# Patient Record
Sex: Female | Born: 1950 | Race: White | Hispanic: Yes | Marital: Married
Health system: Southern US, Community
[De-identification: ages and names within clinical notes are randomized; demographics above are authoritative.]

---

## 1999-08-03 ENCOUNTER — Other Ambulatory Visit: Admission: RE | Admit: 1999-08-03 | Discharge: 1999-08-03 | Payer: Self-pay | Admitting: Family Medicine

## 1999-08-20 ENCOUNTER — Encounter: Admission: RE | Admit: 1999-08-20 | Discharge: 1999-08-20 | Payer: Self-pay | Admitting: Family Medicine

## 1999-08-20 ENCOUNTER — Encounter: Payer: Self-pay | Admitting: Family Medicine

## 2001-05-22 ENCOUNTER — Other Ambulatory Visit: Admission: RE | Admit: 2001-05-22 | Discharge: 2001-05-22 | Payer: Self-pay | Admitting: Family Medicine

## 2004-04-06 ENCOUNTER — Other Ambulatory Visit: Admission: RE | Admit: 2004-04-06 | Discharge: 2004-04-06 | Payer: Self-pay | Admitting: Physician Assistant

## 2004-04-25 ENCOUNTER — Encounter: Admission: RE | Admit: 2004-04-25 | Discharge: 2004-04-25 | Payer: Self-pay | Admitting: Family Medicine

## 2004-05-29 ENCOUNTER — Ambulatory Visit (HOSPITAL_COMMUNITY): Admission: RE | Admit: 2004-05-29 | Discharge: 2004-05-29 | Payer: Self-pay | Admitting: Gastroenterology

## 2008-04-27 ENCOUNTER — Other Ambulatory Visit: Admission: RE | Admit: 2008-04-27 | Discharge: 2008-04-27 | Payer: Self-pay | Admitting: Family Medicine

## 2008-05-24 ENCOUNTER — Encounter: Admission: RE | Admit: 2008-05-24 | Discharge: 2008-05-24 | Payer: Self-pay | Admitting: Family Medicine

## 2008-05-30 ENCOUNTER — Encounter: Admission: RE | Admit: 2008-05-30 | Discharge: 2008-05-30 | Payer: Self-pay | Admitting: Family Medicine

## 2011-01-18 NOTE — Op Note (Signed)
NAME:  April Reyes, April Reyes NO.:  [id number]   MEDICAL RECORD NO.:  [id number]          PATIENT TYPE:  AMB   LOCATION:  ENDO                         FACILITY:  [facility]   PHYSICIAN:  Graylin Shiver, M.D.   DATE OF BIRTH:  [birth year]   DATE OF PROCEDURE:  05/29/2004  DATE OF DISCHARGE:                                 OPERATIVE REPORT   PROCEDURE:  Colonoscopy.   SURGEON:   INDICATIONS FOR PROCEDURE:  Screening.   Informed consent was obtained after explanation of the risks of bleeding,  infection and perforation.   PREMEDICATION:  Fentanyl 60 mcg IV and Versed 6 mg IV.   DESCRIPTION OF PROCEDURE:  With the patient in the left lateral decubitus  position, a rectal examination was performed.  No masses were felt.  The  Olympus colonoscope was inserted into the rectum and advanced around to the  colon to the cecum.  The cecal landmarks were identified.  The cecum and  ascending colon were normal.  The transverse colon was normal.  The  descending colon, sigmoid and rectum were normal.  The patient tolerated the  procedure well without complications.   IMPRESSION:  Normal colonoscopy to the cecum.   RECOMMENDATIONS:  I would recommend a follow-up colonoscopy again in 10  years.       SFG/MEDQ  D:  05/29/2004  T:  05/29/2004  Job:  [id number]   cc:   Duncan Dull, M.D.  [address]  [place]  [place] [address]  Fax: [phone]

## 2012-06-24 ENCOUNTER — Other Ambulatory Visit: Payer: Self-pay | Admitting: Family Medicine

## 2012-06-24 ENCOUNTER — Other Ambulatory Visit (HOSPITAL_COMMUNITY)
Admission: RE | Admit: 2012-06-24 | Discharge: 2012-06-24 | Disposition: A | Discharge status: Home / Self Care | Payer: BC Managed Care – PPO | Source: Ambulatory Visit | Attending: Family Medicine | Admitting: Family Medicine

## 2012-06-24 DIAGNOSIS — Z124 Encounter for screening for malignant neoplasm of cervix: Secondary | ICD-10-CM | POA: Insufficient documentation

## 2012-06-25 ENCOUNTER — Other Ambulatory Visit: Payer: Self-pay | Admitting: Family Medicine

## 2012-06-25 DIAGNOSIS — Z1231 Encounter for screening mammogram for malignant neoplasm of breast: Secondary | ICD-10-CM

## 2012-07-02 ENCOUNTER — Ambulatory Visit
Admission: RE | Admit: 2012-07-02 | Discharge: 2012-07-02 | Disposition: A | Discharge status: Home / Self Care | Payer: BC Managed Care – PPO | Source: Ambulatory Visit | Attending: Family Medicine | Admitting: Family Medicine

## 2012-07-02 DIAGNOSIS — Z1231 Encounter for screening mammogram for malignant neoplasm of breast: Secondary | ICD-10-CM

## 2012-07-03 ENCOUNTER — Other Ambulatory Visit: Payer: Self-pay | Admitting: Family Medicine

## 2012-07-03 DIAGNOSIS — R928 Other abnormal and inconclusive findings on diagnostic imaging of breast: Secondary | ICD-10-CM

## 2012-07-14 ENCOUNTER — Ambulatory Visit
Admission: RE | Admit: 2012-07-14 | Discharge: 2012-07-14 | Disposition: A | Discharge status: Home / Self Care | Payer: BC Managed Care – PPO | Source: Ambulatory Visit | Attending: Family Medicine | Admitting: Family Medicine

## 2012-07-14 DIAGNOSIS — R928 Other abnormal and inconclusive findings on diagnostic imaging of breast: Secondary | ICD-10-CM

## 2013-09-23 ENCOUNTER — Other Ambulatory Visit: Payer: Self-pay | Admitting: Family Medicine

## 2013-09-23 DIAGNOSIS — M8580 Other specified disorders of bone density and structure, unspecified site: Secondary | ICD-10-CM

## 2013-11-23 ENCOUNTER — Other Ambulatory Visit: Payer: Self-pay

## 2013-11-23 DIAGNOSIS — Z1231 Encounter for screening mammogram for malignant neoplasm of breast: Secondary | ICD-10-CM

## 2013-12-15 ENCOUNTER — Ambulatory Visit
Admission: RE | Admit: 2013-12-15 | Discharge: 2013-12-15 | Disposition: A | Discharge status: Home / Self Care | Payer: BC Managed Care – PPO | Source: Ambulatory Visit | Attending: Family Medicine | Admitting: Family Medicine

## 2013-12-15 ENCOUNTER — Other Ambulatory Visit: Payer: Self-pay

## 2013-12-15 ENCOUNTER — Ambulatory Visit
Admission: RE | Admit: 2013-12-15 | Discharge: 2013-12-15 | Disposition: A | Discharge status: Home / Self Care | Payer: BC Managed Care – PPO | Source: Ambulatory Visit

## 2013-12-15 ENCOUNTER — Other Ambulatory Visit: Payer: Self-pay | Admitting: Family Medicine

## 2013-12-15 DIAGNOSIS — Z1231 Encounter for screening mammogram for malignant neoplasm of breast: Secondary | ICD-10-CM

## 2013-12-15 DIAGNOSIS — M8580 Other specified disorders of bone density and structure, unspecified site: Secondary | ICD-10-CM

## 2013-12-15 DIAGNOSIS — N6459 Other signs and symptoms in breast: Secondary | ICD-10-CM

## 2013-12-27 ENCOUNTER — Ambulatory Visit
Admission: RE | Admit: 2013-12-27 | Discharge: 2013-12-27 | Disposition: A | Discharge status: Home / Self Care | Payer: BC Managed Care – PPO | Source: Ambulatory Visit | Attending: Family Medicine | Admitting: Family Medicine

## 2013-12-27 DIAGNOSIS — N6459 Other signs and symptoms in breast: Secondary | ICD-10-CM

## 2014-09-28 ENCOUNTER — Other Ambulatory Visit (HOSPITAL_COMMUNITY)
Admission: RE | Admit: 2014-09-28 | Discharge: 2014-09-28 | Disposition: A | Discharge status: Home / Self Care | Payer: No Typology Code available for payment source | Source: Ambulatory Visit | Attending: Family Medicine | Admitting: Family Medicine

## 2014-09-28 ENCOUNTER — Other Ambulatory Visit: Payer: Self-pay | Admitting: Family Medicine

## 2014-09-28 DIAGNOSIS — Z1151 Encounter for screening for human papillomavirus (HPV): Secondary | ICD-10-CM | POA: Diagnosis present

## 2014-09-28 DIAGNOSIS — Z124 Encounter for screening for malignant neoplasm of cervix: Secondary | ICD-10-CM | POA: Insufficient documentation

## 2014-10-04 LAB — CYTOLOGY - PAP

## 2015-08-29 ENCOUNTER — Other Ambulatory Visit: Payer: Self-pay | Admitting: Gastroenterology

## 2017-11-06 ENCOUNTER — Other Ambulatory Visit: Payer: Self-pay | Admitting: Family Medicine

## 2017-11-06 ENCOUNTER — Other Ambulatory Visit (HOSPITAL_COMMUNITY)
Admission: RE | Admit: 2017-11-06 | Discharge: 2017-11-06 | Disposition: A | Discharge status: Home / Self Care | Payer: 59 | Source: Ambulatory Visit | Attending: Family Medicine | Admitting: Family Medicine

## 2017-11-06 DIAGNOSIS — Z01411 Encounter for gynecological examination (general) (routine) with abnormal findings: Secondary | ICD-10-CM | POA: Insufficient documentation

## 2017-11-07 LAB — CYTOLOGY - PAP: DIAGNOSIS: NEGATIVE

## 2017-11-10 ENCOUNTER — Other Ambulatory Visit: Payer: Self-pay | Admitting: Family Medicine

## 2017-11-10 DIAGNOSIS — Z139 Encounter for screening, unspecified: Secondary | ICD-10-CM

## 2017-11-10 DIAGNOSIS — M8580 Other specified disorders of bone density and structure, unspecified site: Secondary | ICD-10-CM

## 2017-12-03 ENCOUNTER — Ambulatory Visit
Admission: RE | Admit: 2017-12-03 | Discharge: 2017-12-03 | Disposition: A | Discharge status: Home / Self Care | Payer: Medicare Other | Source: Ambulatory Visit | Attending: Family Medicine | Admitting: Family Medicine

## 2017-12-03 DIAGNOSIS — H04129 Dry eye syndrome of unspecified lacrimal gland: Secondary | ICD-10-CM | POA: Diagnosis not present

## 2017-12-03 DIAGNOSIS — M8580 Other specified disorders of bone density and structure, unspecified site: Secondary | ICD-10-CM

## 2017-12-03 DIAGNOSIS — Z780 Asymptomatic menopausal state: Secondary | ICD-10-CM | POA: Diagnosis not present

## 2017-12-03 DIAGNOSIS — M85851 Other specified disorders of bone density and structure, right thigh: Secondary | ICD-10-CM | POA: Diagnosis not present

## 2017-12-03 DIAGNOSIS — Z1231 Encounter for screening mammogram for malignant neoplasm of breast: Secondary | ICD-10-CM | POA: Diagnosis not present

## 2017-12-03 DIAGNOSIS — Z139 Encounter for screening, unspecified: Secondary | ICD-10-CM

## 2017-12-12 DIAGNOSIS — J329 Chronic sinusitis, unspecified: Secondary | ICD-10-CM | POA: Diagnosis not present

## 2017-12-16 DIAGNOSIS — T360X5A Adverse effect of penicillins, initial encounter: Secondary | ICD-10-CM | POA: Diagnosis not present

## 2017-12-16 DIAGNOSIS — S93401A Sprain of unspecified ligament of right ankle, initial encounter: Secondary | ICD-10-CM | POA: Diagnosis not present

## 2017-12-16 DIAGNOSIS — M25571 Pain in right ankle and joints of right foot: Secondary | ICD-10-CM | POA: Diagnosis not present

## 2017-12-30 DIAGNOSIS — M25571 Pain in right ankle and joints of right foot: Secondary | ICD-10-CM | POA: Diagnosis not present

## 2018-09-30 DIAGNOSIS — H2513 Age-related nuclear cataract, bilateral: Secondary | ICD-10-CM | POA: Diagnosis not present

## 2018-09-30 DIAGNOSIS — H04123 Dry eye syndrome of bilateral lacrimal glands: Secondary | ICD-10-CM | POA: Diagnosis not present

## 2018-09-30 DIAGNOSIS — H52203 Unspecified astigmatism, bilateral: Secondary | ICD-10-CM | POA: Diagnosis not present

## 2018-11-13 DIAGNOSIS — Z23 Encounter for immunization: Secondary | ICD-10-CM | POA: Diagnosis not present

## 2018-11-13 DIAGNOSIS — N952 Postmenopausal atrophic vaginitis: Secondary | ICD-10-CM | POA: Diagnosis not present

## 2018-11-13 DIAGNOSIS — J309 Allergic rhinitis, unspecified: Secondary | ICD-10-CM | POA: Diagnosis not present

## 2018-11-13 DIAGNOSIS — Z131 Encounter for screening for diabetes mellitus: Secondary | ICD-10-CM | POA: Diagnosis not present

## 2018-11-13 DIAGNOSIS — Z136 Encounter for screening for cardiovascular disorders: Secondary | ICD-10-CM | POA: Diagnosis not present

## 2018-11-13 DIAGNOSIS — Z0000 Encounter for general adult medical examination without abnormal findings: Secondary | ICD-10-CM | POA: Diagnosis not present

## 2020-03-08 DIAGNOSIS — E559 Vitamin D deficiency, unspecified: Secondary | ICD-10-CM | POA: Diagnosis not present

## 2020-03-08 DIAGNOSIS — Z13220 Encounter for screening for lipoid disorders: Secondary | ICD-10-CM | POA: Diagnosis not present

## 2020-03-08 DIAGNOSIS — Z23 Encounter for immunization: Secondary | ICD-10-CM | POA: Diagnosis not present

## 2020-03-08 DIAGNOSIS — Z136 Encounter for screening for cardiovascular disorders: Secondary | ICD-10-CM | POA: Diagnosis not present

## 2020-05-11 DIAGNOSIS — Z23 Encounter for immunization: Secondary | ICD-10-CM | POA: Diagnosis not present

## 2020-06-05 DIAGNOSIS — L821 Other seborrheic keratosis: Secondary | ICD-10-CM | POA: Diagnosis not present

## 2020-06-05 DIAGNOSIS — D2239 Melanocytic nevi of other parts of face: Secondary | ICD-10-CM | POA: Diagnosis not present

## 2020-06-05 DIAGNOSIS — L918 Other hypertrophic disorders of the skin: Secondary | ICD-10-CM | POA: Diagnosis not present

## 2020-06-05 DIAGNOSIS — L814 Other melanin hyperpigmentation: Secondary | ICD-10-CM | POA: Diagnosis not present

## 2020-06-05 DIAGNOSIS — L811 Chloasma: Secondary | ICD-10-CM | POA: Diagnosis not present

## 2020-06-05 DIAGNOSIS — L819 Disorder of pigmentation, unspecified: Secondary | ICD-10-CM | POA: Diagnosis not present

## 2020-06-09 DIAGNOSIS — H04123 Dry eye syndrome of bilateral lacrimal glands: Secondary | ICD-10-CM | POA: Diagnosis not present

## 2020-06-09 DIAGNOSIS — H524 Presbyopia: Secondary | ICD-10-CM | POA: Diagnosis not present

## 2020-06-09 DIAGNOSIS — D3131 Benign neoplasm of right choroid: Secondary | ICD-10-CM | POA: Diagnosis not present

## 2020-06-09 DIAGNOSIS — H2513 Age-related nuclear cataract, bilateral: Secondary | ICD-10-CM | POA: Diagnosis not present

## 2020-06-14 DIAGNOSIS — Z23 Encounter for immunization: Secondary | ICD-10-CM | POA: Diagnosis not present

## 2020-11-24 ENCOUNTER — Other Ambulatory Visit: Payer: Self-pay | Admitting: Family Medicine

## 2020-11-24 DIAGNOSIS — E559 Vitamin D deficiency, unspecified: Secondary | ICD-10-CM | POA: Diagnosis not present

## 2020-11-24 DIAGNOSIS — E7800 Pure hypercholesterolemia, unspecified: Secondary | ICD-10-CM | POA: Diagnosis not present

## 2020-11-24 DIAGNOSIS — M8580 Other specified disorders of bone density and structure, unspecified site: Secondary | ICD-10-CM

## 2020-11-24 DIAGNOSIS — Z8249 Family history of ischemic heart disease and other diseases of the circulatory system: Secondary | ICD-10-CM | POA: Diagnosis not present

## 2020-11-24 DIAGNOSIS — Z0000 Encounter for general adult medical examination without abnormal findings: Secondary | ICD-10-CM | POA: Diagnosis not present

## 2020-11-24 DIAGNOSIS — M8589 Other specified disorders of bone density and structure, multiple sites: Secondary | ICD-10-CM | POA: Diagnosis not present

## 2020-11-27 DIAGNOSIS — Z111 Encounter for screening for respiratory tuberculosis: Secondary | ICD-10-CM | POA: Diagnosis not present

## 2020-11-29 ENCOUNTER — Other Ambulatory Visit (HOSPITAL_COMMUNITY): Payer: Self-pay | Admitting: Family Medicine

## 2020-12-13 ENCOUNTER — Ambulatory Visit (HOSPITAL_COMMUNITY)
Admission: RE | Admit: 2020-12-13 | Discharge: 2020-12-13 | Disposition: A | Discharge status: Home / Self Care | Payer: Self-pay | Source: Ambulatory Visit | Attending: Family Medicine | Admitting: Family Medicine

## 2020-12-13 ENCOUNTER — Other Ambulatory Visit: Payer: Self-pay

## 2020-12-13 DIAGNOSIS — Z8249 Family history of ischemic heart disease and other diseases of the circulatory system: Secondary | ICD-10-CM | POA: Insufficient documentation

## 2020-12-13 DIAGNOSIS — E559 Vitamin D deficiency, unspecified: Secondary | ICD-10-CM | POA: Insufficient documentation

## 2020-12-13 DIAGNOSIS — Z0000 Encounter for general adult medical examination without abnormal findings: Secondary | ICD-10-CM | POA: Insufficient documentation

## 2020-12-13 DIAGNOSIS — E7800 Pure hypercholesterolemia, unspecified: Secondary | ICD-10-CM | POA: Insufficient documentation

## 2020-12-13 DIAGNOSIS — M8589 Other specified disorders of bone density and structure, multiple sites: Secondary | ICD-10-CM | POA: Insufficient documentation

## 2020-12-19 ENCOUNTER — Other Ambulatory Visit: Payer: Self-pay | Admitting: Family Medicine

## 2020-12-19 DIAGNOSIS — Z1231 Encounter for screening mammogram for malignant neoplasm of breast: Secondary | ICD-10-CM

## 2021-01-13 DIAGNOSIS — Z23 Encounter for immunization: Secondary | ICD-10-CM | POA: Diagnosis not present

## 2021-03-28 DIAGNOSIS — Z23 Encounter for immunization: Secondary | ICD-10-CM | POA: Diagnosis not present

## 2021-05-10 ENCOUNTER — Other Ambulatory Visit: Payer: Self-pay

## 2021-05-10 ENCOUNTER — Ambulatory Visit
Admission: RE | Admit: 2021-05-10 | Discharge: 2021-05-10 | Disposition: A | Discharge status: Home / Self Care | Payer: Medicare Other | Source: Ambulatory Visit | Attending: Family Medicine | Admitting: Family Medicine

## 2021-05-10 DIAGNOSIS — Z1231 Encounter for screening mammogram for malignant neoplasm of breast: Secondary | ICD-10-CM | POA: Diagnosis not present

## 2021-05-10 DIAGNOSIS — M8589 Other specified disorders of bone density and structure, multiple sites: Secondary | ICD-10-CM | POA: Diagnosis not present

## 2021-05-10 DIAGNOSIS — M8580 Other specified disorders of bone density and structure, unspecified site: Secondary | ICD-10-CM

## 2021-05-10 DIAGNOSIS — Z780 Asymptomatic menopausal state: Secondary | ICD-10-CM | POA: Diagnosis not present

## 2021-05-26 DIAGNOSIS — Z23 Encounter for immunization: Secondary | ICD-10-CM | POA: Diagnosis not present

## 2021-06-16 DIAGNOSIS — Z23 Encounter for immunization: Secondary | ICD-10-CM | POA: Diagnosis not present

## 2021-07-23 DIAGNOSIS — R051 Acute cough: Secondary | ICD-10-CM | POA: Diagnosis not present

## 2021-07-23 DIAGNOSIS — U071 COVID-19: Secondary | ICD-10-CM | POA: Diagnosis not present

## 2021-12-08 IMAGING — MG MM DIGITAL SCREENING BILAT W/ TOMO AND CAD
8 series · 9 of 24 positions shown · non-contrast
Comparison: Previous exam(s).

CLINICAL DATA: Screening.

EXAM:
DIGITAL SCREENING BILATERAL MAMMOGRAM WITH TOMOSYNTHESIS AND CAD
TECHNIQUE: Bilateral screening digital craniocaudal and mediolateral oblique
mammograms were obtained. Bilateral screening digital breast
tomosynthesis was performed. The images were evaluated with
computer-aided detection.

[R CC synth-2D]
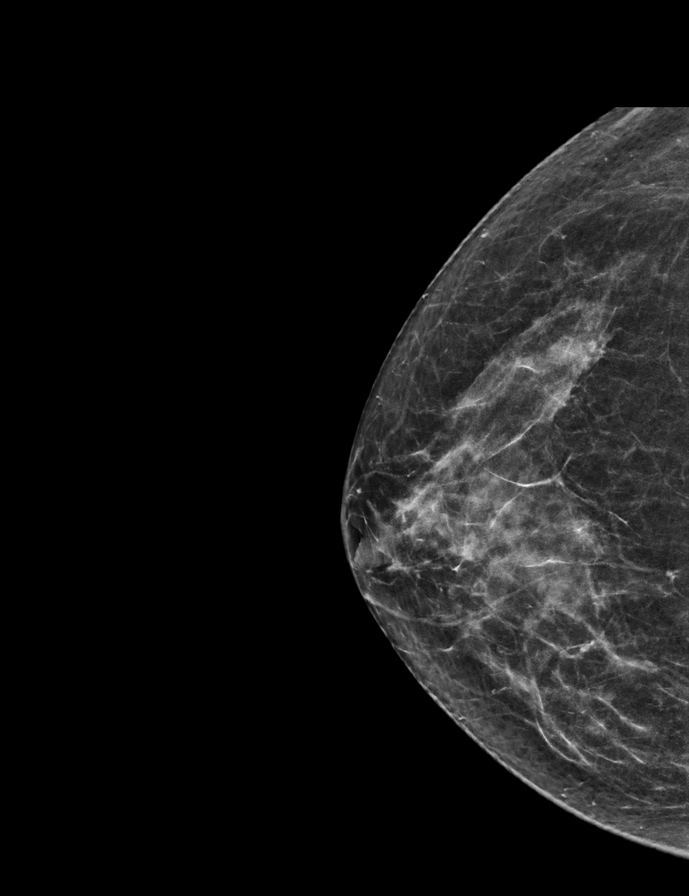

[L CC synth-2D]
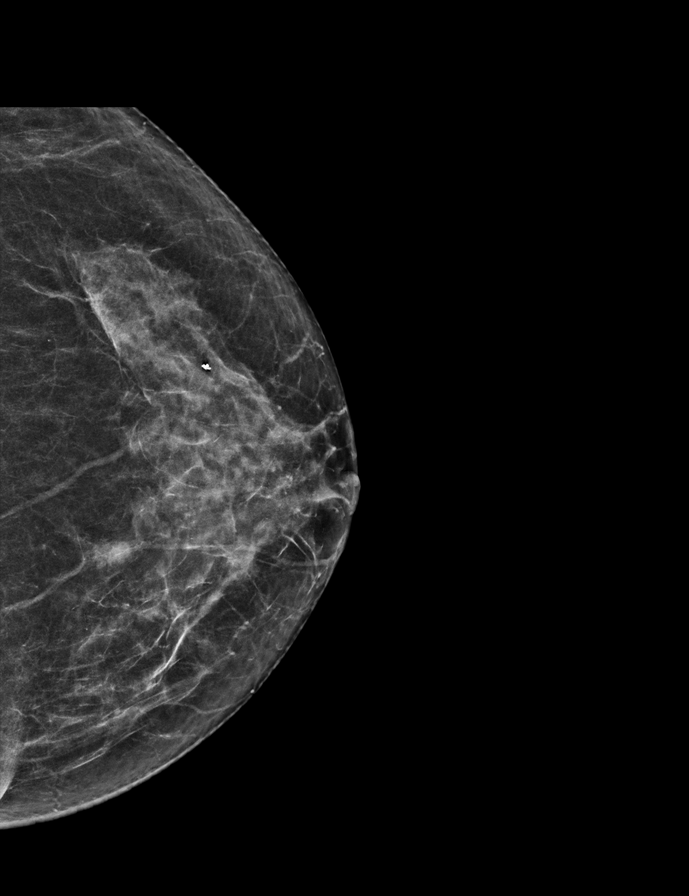

[R MLO synth-2D]
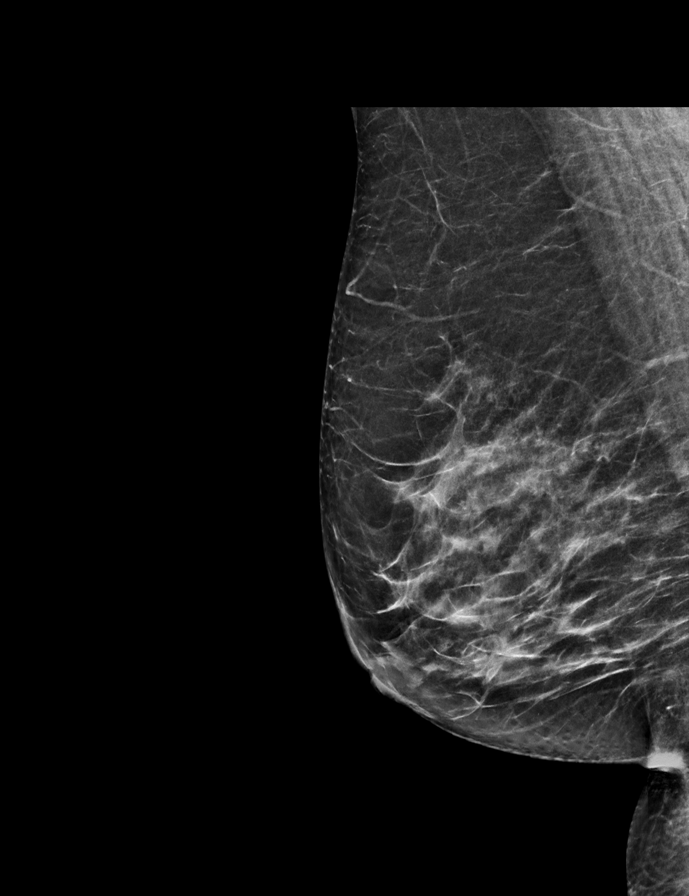

[L MLO synth-2D]
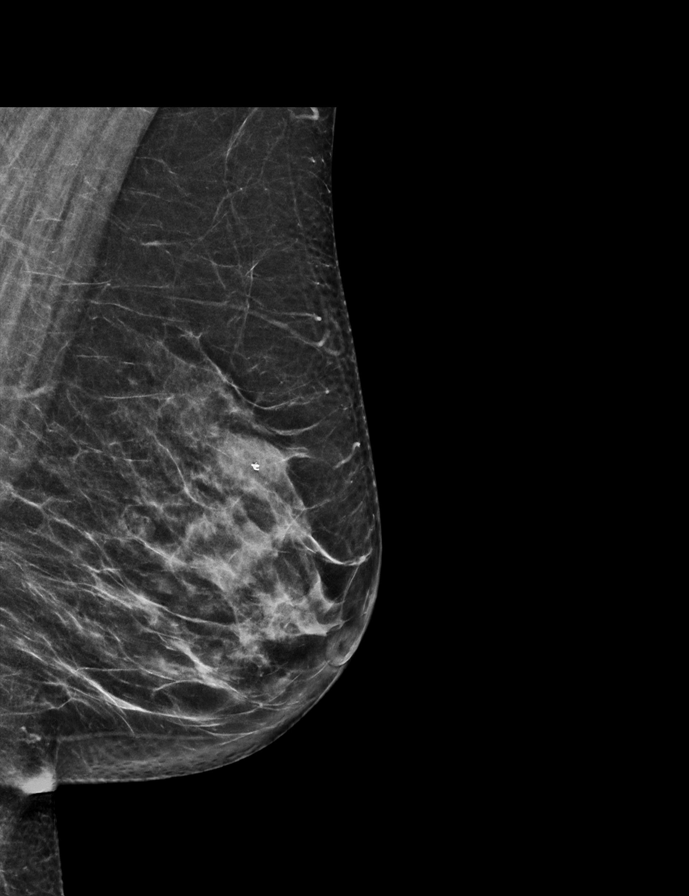

[R CC tomo · 2 of 58 frames shown]
[frame 19/58]
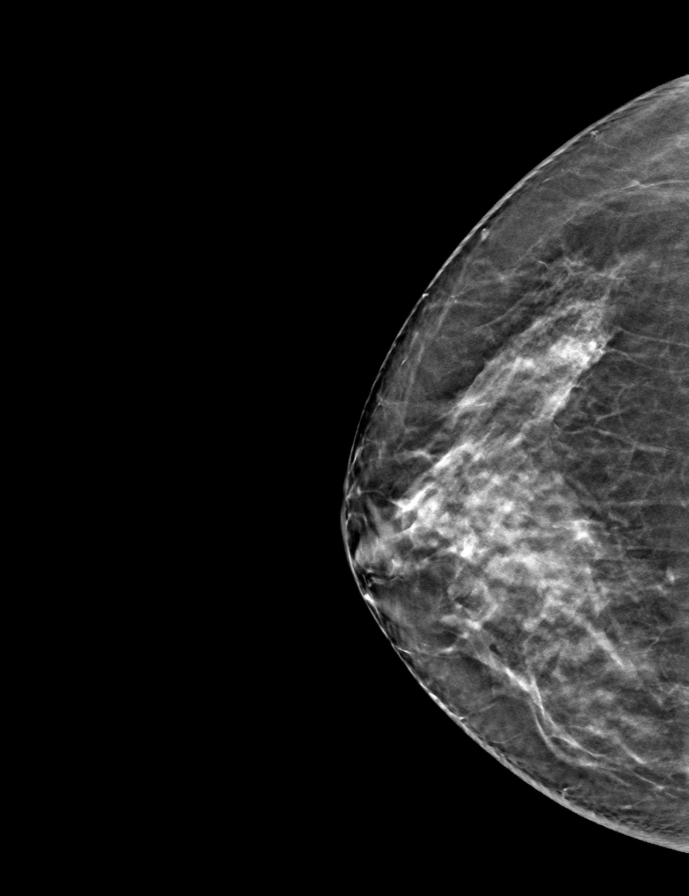
[frame 29/58]
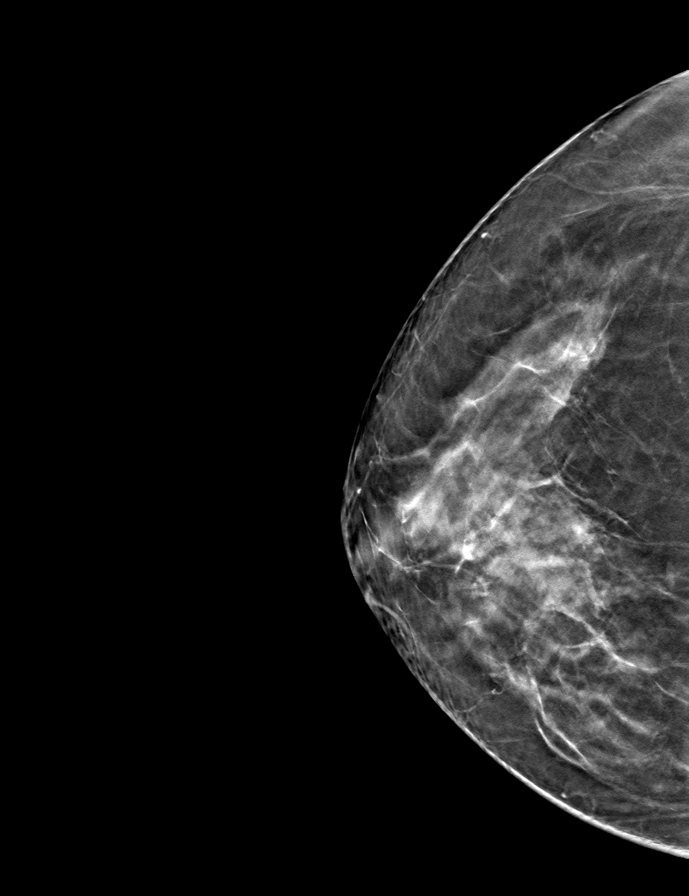

[L MLO tomo · tomo slice 31/60.0]
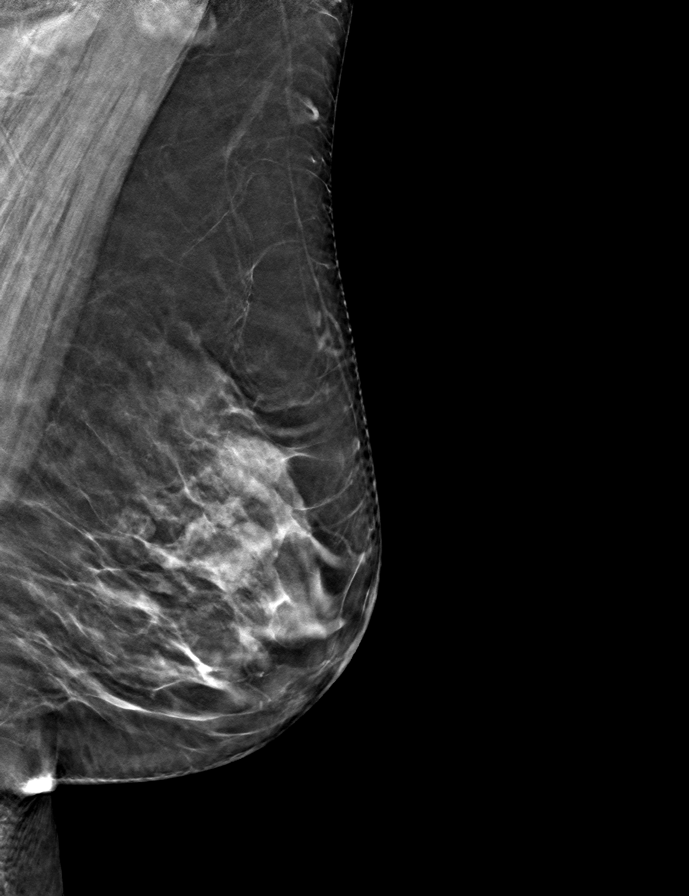

[L CC tomo · tomo slice 29/57.0]
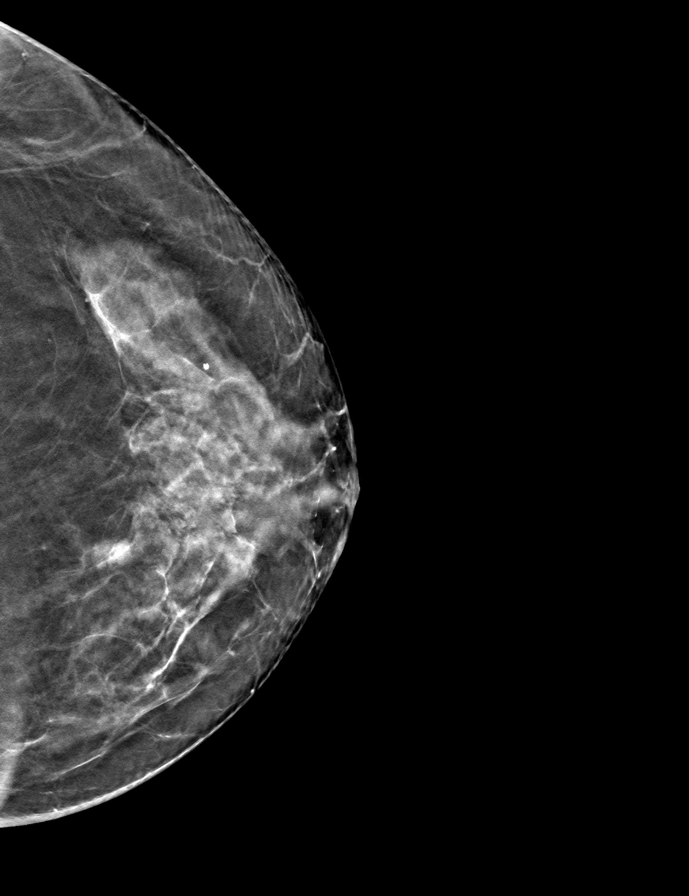

[R MLO tomo · tomo slice 31/60.0]
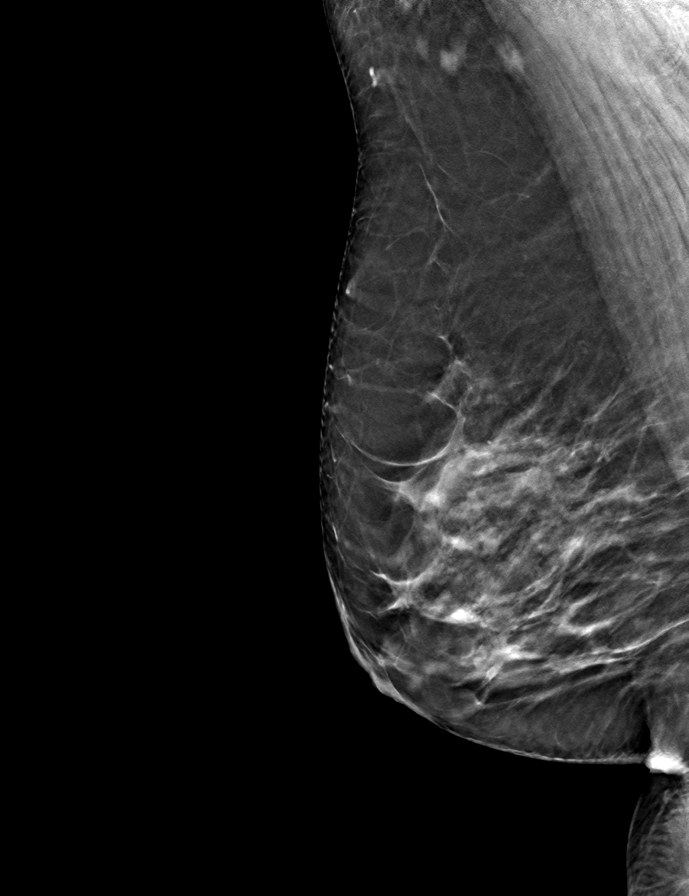

[9 of 24 positions shown; findings below may reference images not displayed]

ACR Breast Density Category c: The breast tissue is heterogeneously
dense, which may obscure small masses.
FINDINGS: There are no findings suspicious for malignancy.
IMPRESSION: No mammographic evidence of malignancy. A result letter of this
screening mammogram will be mailed directly to the patient.

RECOMMENDATION:
Screening mammogram in one year. (Code:Q3-W-BC3)

BI-RADS CATEGORY  1: Negative.

## 2022-04-18 DIAGNOSIS — M8589 Other specified disorders of bone density and structure, multiple sites: Secondary | ICD-10-CM | POA: Diagnosis not present

## 2022-04-18 DIAGNOSIS — Z8249 Family history of ischemic heart disease and other diseases of the circulatory system: Secondary | ICD-10-CM | POA: Diagnosis not present

## 2022-04-18 DIAGNOSIS — Z0000 Encounter for general adult medical examination without abnormal findings: Secondary | ICD-10-CM | POA: Diagnosis not present

## 2022-04-18 DIAGNOSIS — E559 Vitamin D deficiency, unspecified: Secondary | ICD-10-CM | POA: Diagnosis not present

## 2022-04-18 DIAGNOSIS — R160 Hepatomegaly, not elsewhere classified: Secondary | ICD-10-CM | POA: Diagnosis not present

## 2022-04-18 DIAGNOSIS — E7800 Pure hypercholesterolemia, unspecified: Secondary | ICD-10-CM | POA: Diagnosis not present

## 2022-04-19 ENCOUNTER — Other Ambulatory Visit: Payer: Self-pay | Admitting: Family Medicine

## 2022-04-19 DIAGNOSIS — R160 Hepatomegaly, not elsewhere classified: Secondary | ICD-10-CM

## 2022-04-26 ENCOUNTER — Ambulatory Visit
Admission: RE | Admit: 2022-04-26 | Discharge: 2022-04-26 | Disposition: A | Discharge status: Home / Self Care | Payer: Medicare Other | Source: Ambulatory Visit | Attending: Family Medicine | Admitting: Family Medicine

## 2022-04-26 DIAGNOSIS — R160 Hepatomegaly, not elsewhere classified: Secondary | ICD-10-CM

## 2022-04-26 DIAGNOSIS — K7689 Other specified diseases of liver: Secondary | ICD-10-CM | POA: Diagnosis not present

## 2022-04-30 DIAGNOSIS — D72819 Decreased white blood cell count, unspecified: Secondary | ICD-10-CM | POA: Diagnosis not present

## 2022-05-09 ENCOUNTER — Other Ambulatory Visit: Payer: Self-pay | Admitting: Family Medicine

## 2022-05-09 DIAGNOSIS — Q446 Cystic disease of liver: Secondary | ICD-10-CM

## 2022-05-19 ENCOUNTER — Ambulatory Visit
Admission: RE | Admit: 2022-05-19 | Discharge: 2022-05-19 | Disposition: A | Discharge status: Home / Self Care | Payer: Medicare Other | Source: Ambulatory Visit | Attending: Family Medicine | Admitting: Family Medicine

## 2022-05-19 DIAGNOSIS — K7689 Other specified diseases of liver: Secondary | ICD-10-CM | POA: Diagnosis not present

## 2022-05-19 DIAGNOSIS — N281 Cyst of kidney, acquired: Secondary | ICD-10-CM | POA: Diagnosis not present

## 2022-05-19 DIAGNOSIS — Q859 Phakomatosis, unspecified: Secondary | ICD-10-CM | POA: Diagnosis not present

## 2022-05-19 DIAGNOSIS — Q446 Cystic disease of liver: Secondary | ICD-10-CM

## 2022-05-27 DIAGNOSIS — H16223 Keratoconjunctivitis sicca, not specified as Sjogren's, bilateral: Secondary | ICD-10-CM | POA: Diagnosis not present

## 2022-06-12 DIAGNOSIS — Z23 Encounter for immunization: Secondary | ICD-10-CM | POA: Diagnosis not present

## 2022-07-18 DIAGNOSIS — H16223 Keratoconjunctivitis sicca, not specified as Sjogren's, bilateral: Secondary | ICD-10-CM | POA: Diagnosis not present

## 2022-08-22 DIAGNOSIS — H524 Presbyopia: Secondary | ICD-10-CM | POA: Diagnosis not present

## 2022-08-22 DIAGNOSIS — Z8669 Personal history of other diseases of the nervous system and sense organs: Secondary | ICD-10-CM | POA: Diagnosis not present

## 2022-08-22 DIAGNOSIS — H16223 Keratoconjunctivitis sicca, not specified as Sjogren's, bilateral: Secondary | ICD-10-CM | POA: Diagnosis not present

## 2022-08-22 DIAGNOSIS — H11061 Recurrent pterygium of right eye: Secondary | ICD-10-CM | POA: Diagnosis not present

## 2022-11-08 DIAGNOSIS — H11001 Unspecified pterygium of right eye: Secondary | ICD-10-CM | POA: Diagnosis not present

## 2023-01-11 DIAGNOSIS — J020 Streptococcal pharyngitis: Secondary | ICD-10-CM | POA: Diagnosis not present

## 2023-05-07 DIAGNOSIS — E7800 Pure hypercholesterolemia, unspecified: Secondary | ICD-10-CM | POA: Diagnosis not present

## 2023-05-07 DIAGNOSIS — Z79899 Other long term (current) drug therapy: Secondary | ICD-10-CM | POA: Diagnosis not present

## 2023-05-07 DIAGNOSIS — M8589 Other specified disorders of bone density and structure, multiple sites: Secondary | ICD-10-CM | POA: Diagnosis not present

## 2023-05-07 DIAGNOSIS — E559 Vitamin D deficiency, unspecified: Secondary | ICD-10-CM | POA: Diagnosis not present

## 2023-05-07 DIAGNOSIS — R160 Hepatomegaly, not elsewhere classified: Secondary | ICD-10-CM | POA: Diagnosis not present

## 2023-05-07 DIAGNOSIS — Z0000 Encounter for general adult medical examination without abnormal findings: Secondary | ICD-10-CM | POA: Diagnosis not present

## 2023-05-07 DIAGNOSIS — Z23 Encounter for immunization: Secondary | ICD-10-CM | POA: Diagnosis not present

## 2023-05-13 ENCOUNTER — Other Ambulatory Visit: Payer: Self-pay | Admitting: Family Medicine

## 2023-05-13 DIAGNOSIS — E2839 Other primary ovarian failure: Secondary | ICD-10-CM

## 2023-06-20 ENCOUNTER — Other Ambulatory Visit: Payer: Self-pay | Admitting: Family Medicine

## 2023-06-20 DIAGNOSIS — Z1231 Encounter for screening mammogram for malignant neoplasm of breast: Secondary | ICD-10-CM

## 2023-07-17 ENCOUNTER — Ambulatory Visit
Admission: RE | Admit: 2023-07-17 | Discharge: 2023-07-17 | Disposition: A | Discharge status: Home / Self Care | Payer: Medicare Other | Source: Ambulatory Visit | Attending: Family Medicine | Admitting: Family Medicine

## 2023-07-17 DIAGNOSIS — Z1231 Encounter for screening mammogram for malignant neoplasm of breast: Secondary | ICD-10-CM | POA: Diagnosis not present

## 2024-01-23 ENCOUNTER — Ambulatory Visit
Admission: RE | Admit: 2024-01-23 | Discharge: 2024-01-23 | Disposition: A | Discharge status: Home / Self Care | Payer: Medicare Other | Source: Ambulatory Visit | Attending: Family Medicine | Admitting: Family Medicine

## 2024-01-23 DIAGNOSIS — E2839 Other primary ovarian failure: Secondary | ICD-10-CM | POA: Diagnosis not present

## 2024-01-23 DIAGNOSIS — N958 Other specified menopausal and perimenopausal disorders: Secondary | ICD-10-CM | POA: Diagnosis not present

## 2024-01-23 DIAGNOSIS — M8588 Other specified disorders of bone density and structure, other site: Secondary | ICD-10-CM | POA: Diagnosis not present

## 2024-01-31 DIAGNOSIS — E7800 Pure hypercholesterolemia, unspecified: Secondary | ICD-10-CM | POA: Diagnosis not present

## 2024-02-16 DIAGNOSIS — L718 Other rosacea: Secondary | ICD-10-CM | POA: Diagnosis not present

## 2024-02-16 DIAGNOSIS — H16223 Keratoconjunctivitis sicca, not specified as Sjogren's, bilateral: Secondary | ICD-10-CM | POA: Diagnosis not present

## 2024-02-16 DIAGNOSIS — D3131 Benign neoplasm of right choroid: Secondary | ICD-10-CM | POA: Diagnosis not present

## 2024-02-16 DIAGNOSIS — H11043 Peripheral pterygium, stationary, bilateral: Secondary | ICD-10-CM | POA: Diagnosis not present

## 2024-02-16 DIAGNOSIS — H2513 Age-related nuclear cataract, bilateral: Secondary | ICD-10-CM | POA: Diagnosis not present

## 2024-02-16 DIAGNOSIS — H01009 Unspecified blepharitis unspecified eye, unspecified eyelid: Secondary | ICD-10-CM | POA: Diagnosis not present

## 2024-02-16 DIAGNOSIS — B880 Other acariasis: Secondary | ICD-10-CM | POA: Diagnosis not present

## 2024-02-16 DIAGNOSIS — H0288A Meibomian gland dysfunction right eye, upper and lower eyelids: Secondary | ICD-10-CM | POA: Diagnosis not present

## 2024-02-16 DIAGNOSIS — H0288B Meibomian gland dysfunction left eye, upper and lower eyelids: Secondary | ICD-10-CM | POA: Diagnosis not present

## 2024-02-16 DIAGNOSIS — H524 Presbyopia: Secondary | ICD-10-CM | POA: Diagnosis not present

## 2024-03-01 DIAGNOSIS — E7800 Pure hypercholesterolemia, unspecified: Secondary | ICD-10-CM | POA: Diagnosis not present

## 2024-04-01 DIAGNOSIS — E7800 Pure hypercholesterolemia, unspecified: Secondary | ICD-10-CM | POA: Diagnosis not present

## 2024-04-04 DIAGNOSIS — E663 Overweight: Secondary | ICD-10-CM | POA: Diagnosis not present

## 2024-04-14 DIAGNOSIS — H16223 Keratoconjunctivitis sicca, not specified as Sjogren's, bilateral: Secondary | ICD-10-CM | POA: Diagnosis not present

## 2024-04-14 DIAGNOSIS — H01009 Unspecified blepharitis unspecified eye, unspecified eyelid: Secondary | ICD-10-CM | POA: Diagnosis not present

## 2024-04-14 DIAGNOSIS — L718 Other rosacea: Secondary | ICD-10-CM | POA: Diagnosis not present

## 2024-04-14 DIAGNOSIS — B880 Other acariasis: Secondary | ICD-10-CM | POA: Diagnosis not present

## 2024-04-14 DIAGNOSIS — H0288A Meibomian gland dysfunction right eye, upper and lower eyelids: Secondary | ICD-10-CM | POA: Diagnosis not present

## 2024-04-14 DIAGNOSIS — H0288B Meibomian gland dysfunction left eye, upper and lower eyelids: Secondary | ICD-10-CM | POA: Diagnosis not present

## 2024-05-02 DIAGNOSIS — E7800 Pure hypercholesterolemia, unspecified: Secondary | ICD-10-CM | POA: Diagnosis not present

## 2024-05-18 DIAGNOSIS — R160 Hepatomegaly, not elsewhere classified: Secondary | ICD-10-CM | POA: Diagnosis not present

## 2024-05-18 DIAGNOSIS — Z8249 Family history of ischemic heart disease and other diseases of the circulatory system: Secondary | ICD-10-CM | POA: Diagnosis not present

## 2024-05-18 DIAGNOSIS — Z79899 Other long term (current) drug therapy: Secondary | ICD-10-CM | POA: Diagnosis not present

## 2024-05-18 DIAGNOSIS — E559 Vitamin D deficiency, unspecified: Secondary | ICD-10-CM | POA: Diagnosis not present

## 2024-05-18 DIAGNOSIS — Z23 Encounter for immunization: Secondary | ICD-10-CM | POA: Diagnosis not present

## 2024-05-18 DIAGNOSIS — Z0000 Encounter for general adult medical examination without abnormal findings: Secondary | ICD-10-CM | POA: Diagnosis not present

## 2024-05-18 DIAGNOSIS — M8589 Other specified disorders of bone density and structure, multiple sites: Secondary | ICD-10-CM | POA: Diagnosis not present

## 2024-05-18 DIAGNOSIS — E7800 Pure hypercholesterolemia, unspecified: Secondary | ICD-10-CM | POA: Diagnosis not present

## 2024-06-01 DIAGNOSIS — E7800 Pure hypercholesterolemia, unspecified: Secondary | ICD-10-CM | POA: Diagnosis not present

## 2024-07-02 DIAGNOSIS — E7800 Pure hypercholesterolemia, unspecified: Secondary | ICD-10-CM | POA: Diagnosis not present

## 2024-08-01 DIAGNOSIS — E7800 Pure hypercholesterolemia, unspecified: Secondary | ICD-10-CM | POA: Diagnosis not present

## 2024-08-03 DIAGNOSIS — E7800 Pure hypercholesterolemia, unspecified: Secondary | ICD-10-CM | POA: Diagnosis not present

## 2024-09-09 ENCOUNTER — Other Ambulatory Visit: Payer: Self-pay | Admitting: Family Medicine

## 2024-09-09 DIAGNOSIS — Z1231 Encounter for screening mammogram for malignant neoplasm of breast: Secondary | ICD-10-CM

## 2024-09-17 ENCOUNTER — Other Ambulatory Visit: Payer: Self-pay | Admitting: Medical Genetics

## 2024-09-24 ENCOUNTER — Ambulatory Visit
Admission: RE | Admit: 2024-09-24 | Discharge: 2024-09-24 | Disposition: A | Source: Ambulatory Visit | Attending: Family Medicine

## 2024-09-24 DIAGNOSIS — Z1231 Encounter for screening mammogram for malignant neoplasm of breast: Secondary | ICD-10-CM

## 2024-09-30 ENCOUNTER — Other Ambulatory Visit

## 2024-09-30 DIAGNOSIS — Z006 Encounter for examination for normal comparison and control in clinical research program: Secondary | ICD-10-CM

## 2024-10-05 ENCOUNTER — Other Ambulatory Visit: Payer: Self-pay | Admitting: Family Medicine

## 2024-10-05 DIAGNOSIS — R928 Other abnormal and inconclusive findings on diagnostic imaging of breast: Secondary | ICD-10-CM

## 2024-10-13 ENCOUNTER — Other Ambulatory Visit

## 2024-10-13 ENCOUNTER — Encounter
